# Patient Record
Sex: Male | Born: 1945
Health system: Northeastern US, Academic
[De-identification: ages and names within clinical notes are randomized; demographics above are authoritative.]

---

## 1999-03-05 ENCOUNTER — Encounter: Payer: Self-pay | Admitting: Cardiovascular Disease

## 2011-12-30 ENCOUNTER — Encounter: Payer: Self-pay | Admitting: Gastroenterology

## 2011-12-30 LAB — HM COLONOSCOPY

## 2013-05-17 ENCOUNTER — Encounter: Payer: Self-pay | Admitting: Gastroenterology

## 2013-05-25 ENCOUNTER — Encounter: Payer: Self-pay | Admitting: Gastroenterology

## 2013-07-11 ENCOUNTER — Encounter: Payer: Self-pay | Admitting: Gastroenterology

## 2013-08-18 ENCOUNTER — Encounter: Payer: Self-pay | Admitting: Gastroenterology

## 2014-07-25 ENCOUNTER — Encounter: Payer: Self-pay | Admitting: Gastroenterology

## 2014-07-25 LAB — LDL CHOLESTEROL, DIRECT: LDL Calculated: 92

## 2014-09-14 DIAGNOSIS — K2971 Gastritis, unspecified, with bleeding: Secondary | ICD-10-CM | POA: Insufficient documentation

## 2014-09-14 DIAGNOSIS — D649 Anemia, unspecified: Secondary | ICD-10-CM | POA: Insufficient documentation

## 2014-09-14 DIAGNOSIS — M48061 Spinal stenosis, lumbar region without neurogenic claudication: Secondary | ICD-10-CM | POA: Insufficient documentation

## 2014-09-14 DIAGNOSIS — R5381 Other malaise: Secondary | ICD-10-CM | POA: Insufficient documentation

## 2014-09-14 DIAGNOSIS — K5733 Diverticulitis of large intestine without perforation or abscess with bleeding: Secondary | ICD-10-CM | POA: Insufficient documentation

## 2014-09-14 DIAGNOSIS — N529 Male erectile dysfunction, unspecified: Secondary | ICD-10-CM | POA: Insufficient documentation

## 2014-09-14 DIAGNOSIS — M179 Osteoarthritis of knee, unspecified: Secondary | ICD-10-CM | POA: Insufficient documentation

## 2014-09-14 DIAGNOSIS — M1710 Unilateral primary osteoarthritis, unspecified knee: Secondary | ICD-10-CM | POA: Insufficient documentation

## 2014-09-14 DIAGNOSIS — M5459 Other low back pain: Secondary | ICD-10-CM | POA: Insufficient documentation

## 2014-09-14 DIAGNOSIS — K649 Unspecified hemorrhoids: Secondary | ICD-10-CM

## 2014-09-14 DIAGNOSIS — C61 Malignant neoplasm of prostate: Secondary | ICD-10-CM | POA: Insufficient documentation

## 2014-09-14 DIAGNOSIS — E785 Hyperlipidemia, unspecified: Secondary | ICD-10-CM | POA: Insufficient documentation

## 2014-09-14 DIAGNOSIS — Q825 Congenital non-neoplastic nevus: Secondary | ICD-10-CM | POA: Insufficient documentation

## 2014-09-14 DIAGNOSIS — N201 Calculus of ureter: Secondary | ICD-10-CM | POA: Insufficient documentation

## 2014-09-14 DIAGNOSIS — I10 Essential (primary) hypertension: Secondary | ICD-10-CM | POA: Insufficient documentation

## 2014-09-14 DIAGNOSIS — G4730 Sleep apnea, unspecified: Secondary | ICD-10-CM

## 2014-09-14 DIAGNOSIS — D126 Benign neoplasm of colon, unspecified: Secondary | ICD-10-CM | POA: Insufficient documentation

## 2014-09-14 DIAGNOSIS — A6000 Herpesviral infection of urogenital system, unspecified: Secondary | ICD-10-CM

## 2014-10-03 ENCOUNTER — Ambulatory Visit: Payer: Self-pay

## 2014-10-19 ENCOUNTER — Encounter: Payer: Self-pay | Admitting: Gastroenterology

## 2014-10-20 ENCOUNTER — Other Ambulatory Visit: Payer: Self-pay

## 2014-10-20 LAB — CBC AND DIFFERENTIAL
Hematocrit: 39
Hemoglobin: 12.2

## 2014-10-20 LAB — COMPREHENSIVE METABOLIC PANEL
ALT: 38
AST: 24
Chloride: 4
Creatinine: 0.9
Glucose: 101
Lab: 19

## 2014-10-20 LAB — LIPID PANEL
Cholesterol: 176
LDL Calculated: 91
Triglycerides: 127

## 2014-10-20 LAB — PSA,FLUID: PSA: <0.01

## 2014-10-20 LAB — TSH: TSH Stimulation: 1.73

## 2014-10-23 ENCOUNTER — Ambulatory Visit: Payer: Self-pay

## 2014-10-23 VITALS — BP 118/64 | HR 73 | Temp 97.3°F | Ht 70.5 in | Wt 231.6 lb

## 2014-10-23 DIAGNOSIS — M79662 Pain in left lower leg: Secondary | ICD-10-CM

## 2014-10-23 DIAGNOSIS — I10 Essential (primary) hypertension: Secondary | ICD-10-CM

## 2014-10-23 DIAGNOSIS — E782 Mixed hyperlipidemia: Secondary | ICD-10-CM

## 2014-10-23 NOTE — Progress Notes (Signed)
Jeremiah Harvey is a 69 y.o. male    Chief Complaint:  Chief Complaint     Hyperlipidemia; Hypertension; Leg Pain          HPI:  The primary encounter diagnosis was Benign hypertension. Diagnoses of Mixed hyperlipidemia and Pain of left calf were also pertinent to this visit.   Left calf muscle pull playing golf, improving    Review of Systems  No other sx        Objective:   Physical Exam  Vitals:    10/23/14 1430   BP: 118/64   Pulse: 73   Temp: 36.3 C (97.3 F)   TempSrc: Temporal   SpO2: 98%   Weight: 105.1 kg (231 lb 9.6 oz)   Height: 1.791 m (5' 10.5")     HEENT: Fundoscopy normal  Neck: no nodes, no bruits, normal thyroid  Lungs: clear  C-V Regular without murmur  Left calf: non-tender, no cords, neg Homan's, no swelling  Feet: no edema      Lab studies:  Recent Results (from the past 336 hour(s))   CBC and differential    Collection Time: 10/20/14 12:00 AM   Result Value Ref Range    WBC      RBC      Hemoglobin 12.2     Hematocrit 39.00     Platelets      Seg Neut %      Neut # K/uL  /L    Lymphocyte %      Lymph # K/uL  /L    Monocyte %      Mono # K/uL  /L    Eosinophil %      Eos # K/uL  /L    Basophil %      Baso # K/uL  /L    MCV      RDW      Nucl RBC %      Nucl RBC # K/uL     Comprehensive metabolic panel    Collection Time: 10/20/14 12:00 AM   Result Value Ref Range    Glucose 101     Sodium      Potassium      Chloride 4     CO2      Anion Gap      UN 19     Creatinine 0.90     GFR,Caucasian      GFR,Black      Calcium      Total Protein      Albumin      Bilirubin,Total      AST 24     ALT 38     Alk Phos     Lipid add Rfx to Drt LDL if Trig >400    Collection Time: 10/20/14 12:00 AM   Result Value Ref Range    Cholesterol 176     Chol/HDL Ratio      LDL Calculated 91     LDL Direct  mg/dl    Triglycerides 127     Non HDL Cholesterol      HDL     TSH    Collection Time: 10/20/14 12:00 AM   Result Value Ref Range    TSH Stimulation 1.73    PSA, fluid    Collection Time: 10/20/14 12:00 AM    Result Value Ref Range    PSA <0.01           Imaging:  No images are attached to  the encounter.           Assessment/Plan:  1. Benign hypertension  Controlled, continue current regimen.      2. Mixed hyperlipidemia  Low Fat Diet, Exercise, and Weight Loss   Medication as ordered      3. Pain of left calf  New problem, muscle strain.  Activity as tolerated               Dereck Leep, MD 10/23/2014 2:51 PM

## 2014-12-27 ENCOUNTER — Other Ambulatory Visit: Payer: Self-pay

## 2014-12-27 MED ORDER — LISINOPRIL 20 MG PO TABS *I*
20.0000 mg | ORAL_TABLET | Freq: Every day | ORAL | 1 refills | Status: DC
Start: 2014-12-27 — End: 2015-06-23

## 2014-12-27 MED ORDER — ATORVASTATIN CALCIUM 80 MG PO TABS *I*
80.0000 mg | ORAL_TABLET | Freq: Every day | ORAL | 1 refills | Status: DC
Start: 2014-12-27 — End: 2015-06-23

## 2014-12-27 NOTE — Telephone Encounter (Signed)
Patient is currently in [place], he is looking for a new doctor but does not have an interview until April 11, 2015.

## 2015-06-21 ENCOUNTER — Other Ambulatory Visit: Payer: Self-pay

## 2015-06-21 NOTE — Telephone Encounter (Signed)
NO VISITS SHOWN

## 2015-06-22 ENCOUNTER — Other Ambulatory Visit: Payer: Self-pay

## 2015-06-22 NOTE — Telephone Encounter (Signed)
Patient is now living in [place] permanently, explained that he will need to make an appointment before medication can be filled & suggested finding a PCP in [place] to assist needs. TBB

## 2015-06-23 MED ORDER — ATORVASTATIN CALCIUM 80 MG PO TABS *I*
80.0000 mg | ORAL_TABLET | Freq: Every day | ORAL | 0 refills | Status: AC
Start: 2015-06-23 — End: ?

## 2015-06-23 MED ORDER — LISINOPRIL 20 MG PO TABS *I*
20.0000 mg | ORAL_TABLET | Freq: Every day | ORAL | 0 refills | Status: AC
Start: 2015-06-23 — End: ?

## 2015-06-23 NOTE — Addendum Note (Signed)
Addended byRosezella Rumpf on: 06/23/2015 09:51 AM     Modules accepted: Orders

## 2015-06-23 NOTE — Telephone Encounter (Signed)
30 day supply sent

## 2022-06-12 IMAGING — DX SHOULDER RIGHT 3 VIEW
1 series · 3 of 3 positions shown · non-contrast
Comparison: None.

________________________________________________________________________________________________ 
SHOULDER RIGHT 3 VIEW, 06/12/2022 [DATE]: 
CLINICAL INDICATION: Shoulder pain.

[Series 1: ap int rot · U · 0.14mm/px · 3 of 3 slices shown]
[im 1/3]
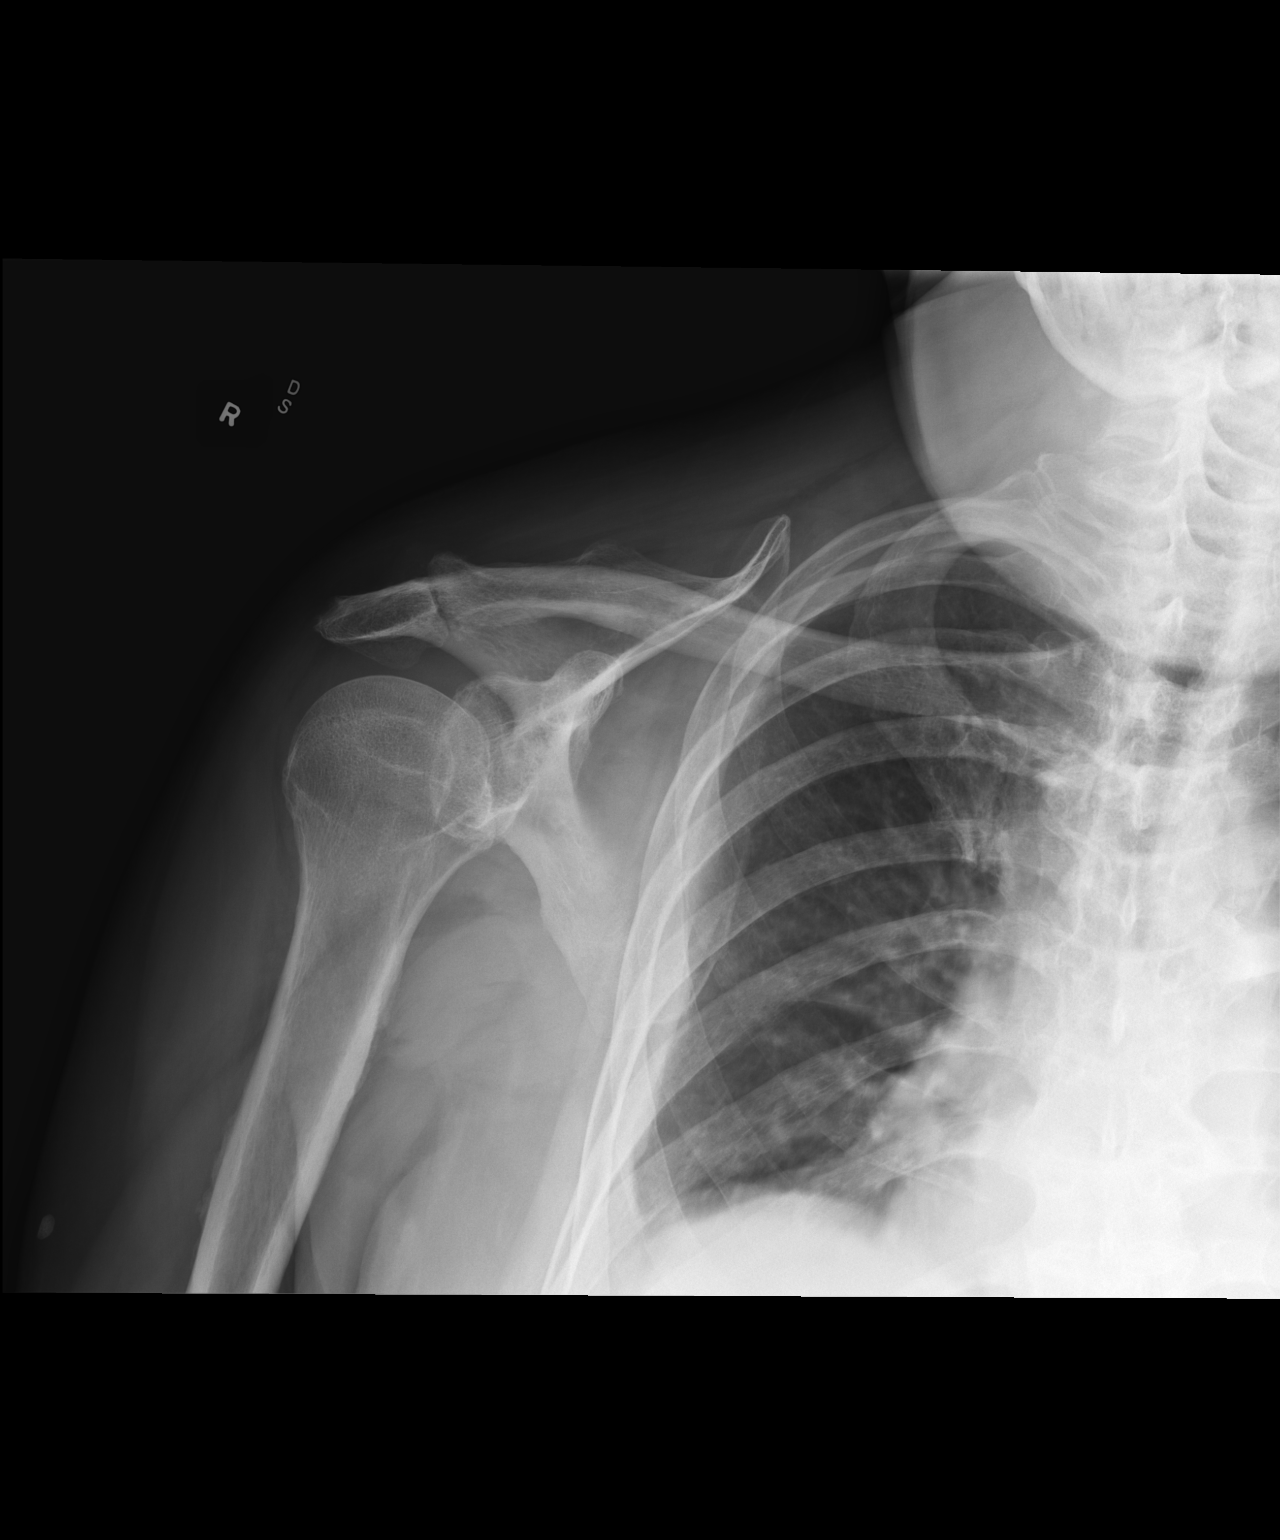
[im 2/3]
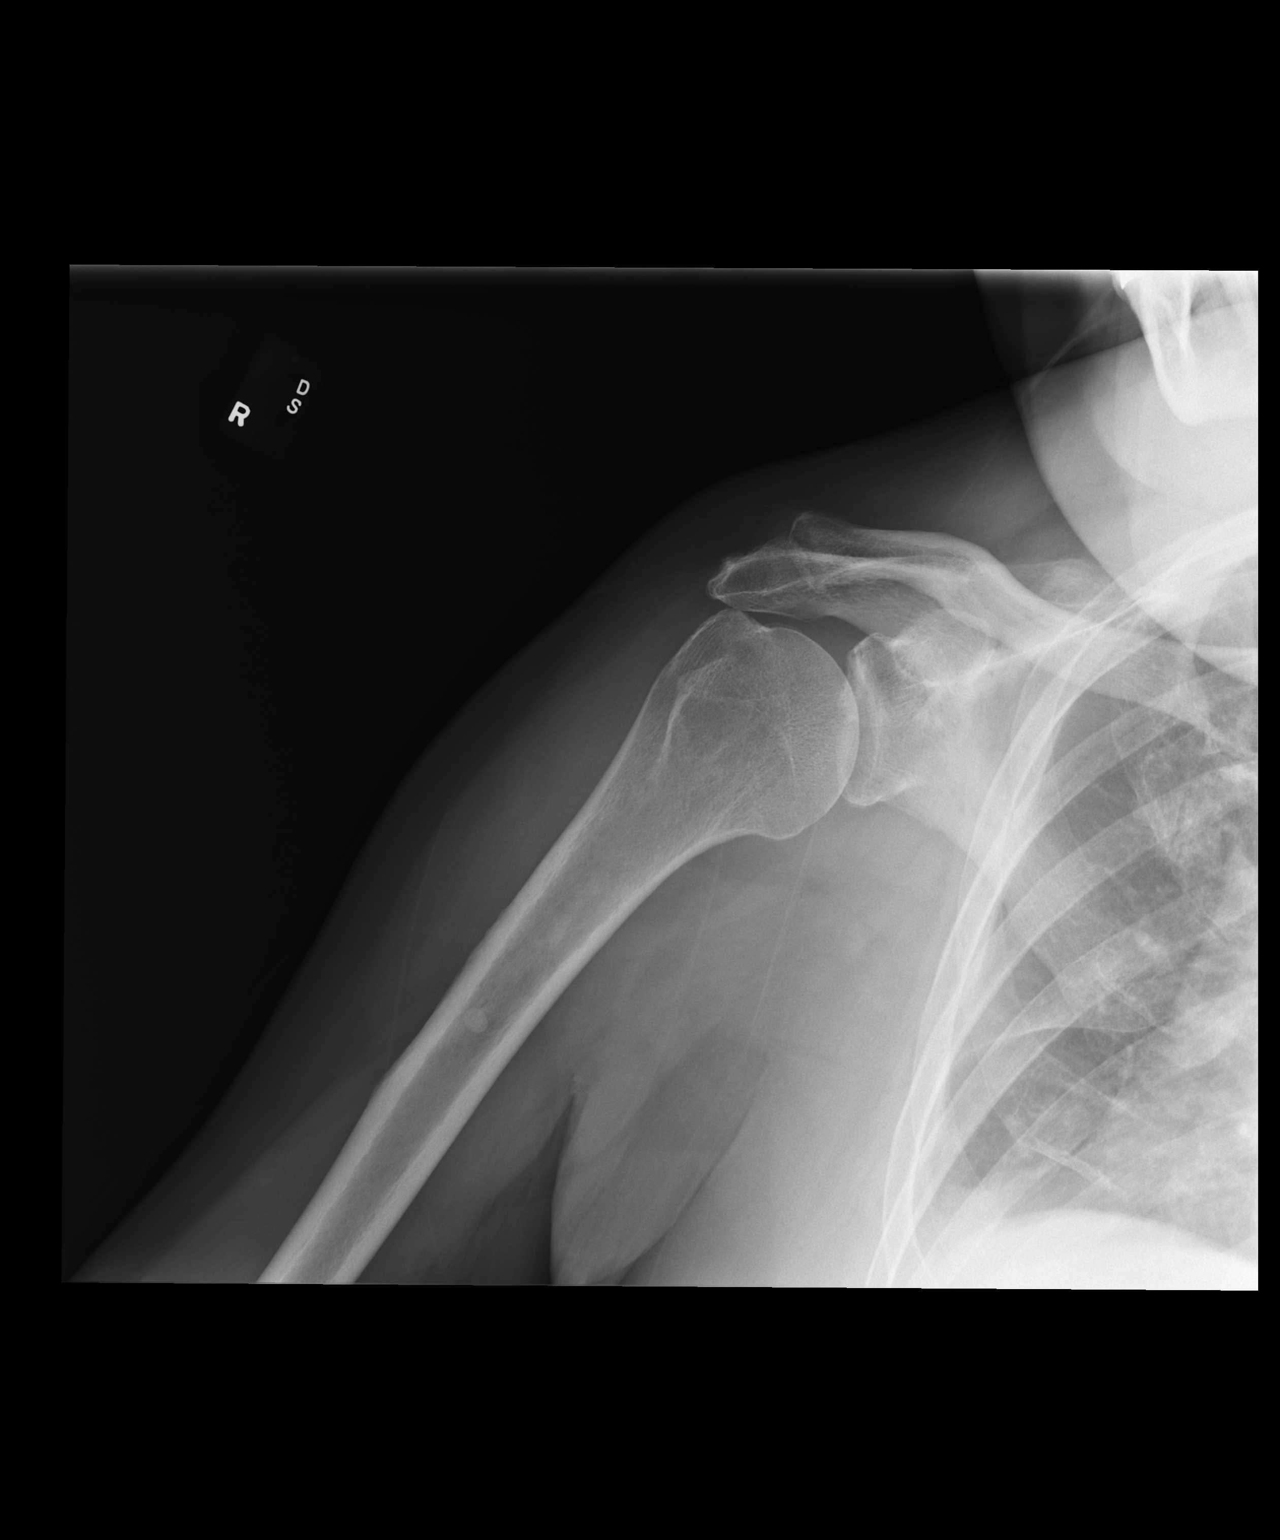
[im 3/3]
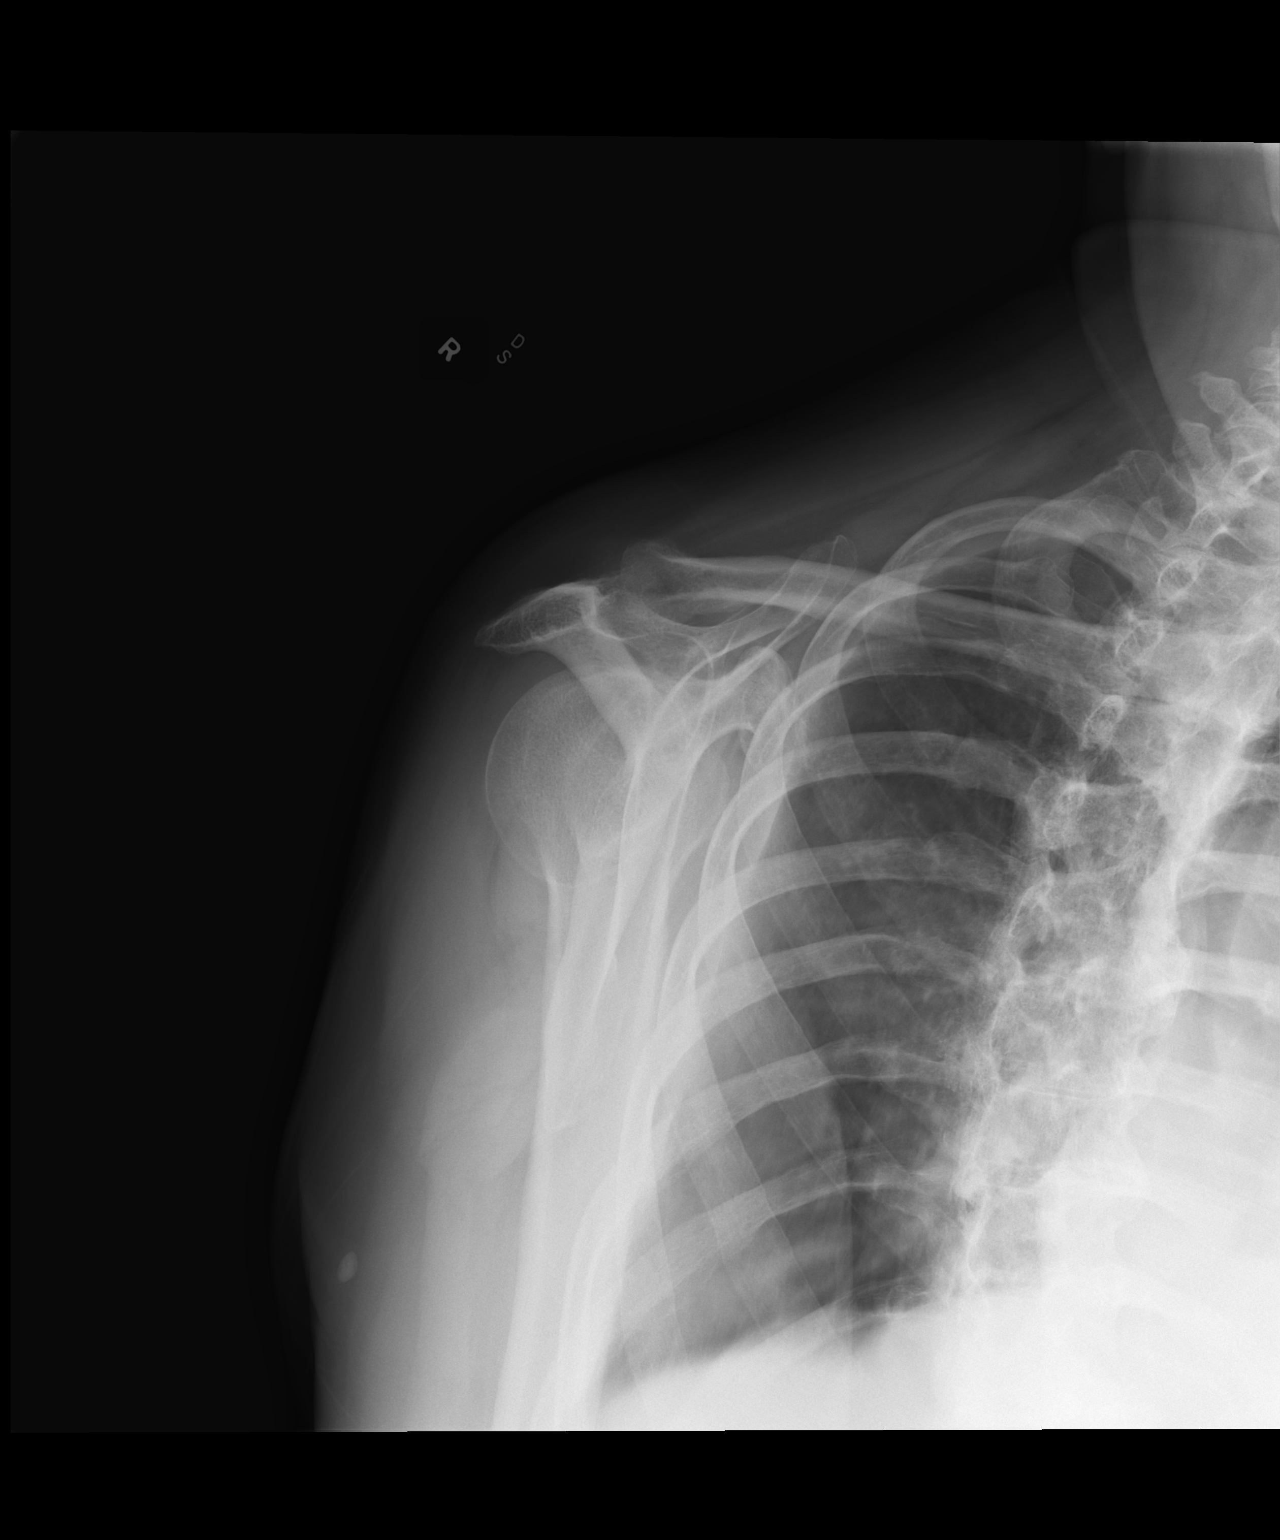

[3 of 3 positions shown; findings below may reference images not displayed]

FINDINGS: No fractures or dislocations. Mild degenerative change of the AC 
joint. Glenohumeral joint space is preserved. Normal bone density. 0.5 cm benign 
lateral arm subcutaneous/skin calcification.
IMPRESSION: Mild degenerative change of the AC joint.

## 2023-03-10 IMAGING — DX LUMBAR SPINE COMPLETE 4 VIEWS
4 series · 4 of 4 positions shown · non-contrast
Comparison: None

________________________________________________________________________________________________ 
LUMBAR SPINE COMPLETE 4 VIEWS, 03/10/2023 [DATE]: 
CLINICAL INDICATION: Radiculopathy, Lumbar Region

[AP]
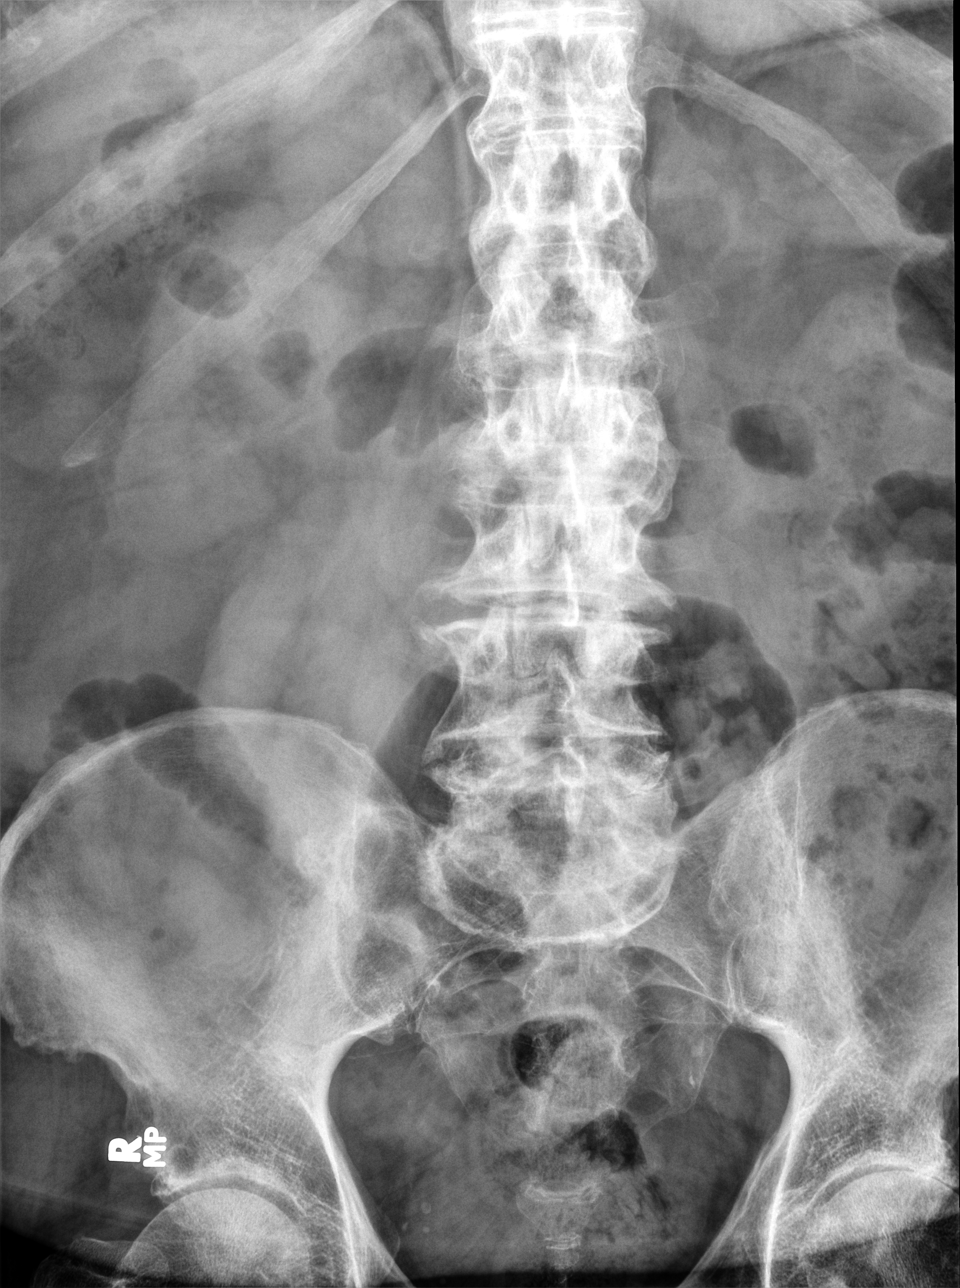

[rpo]
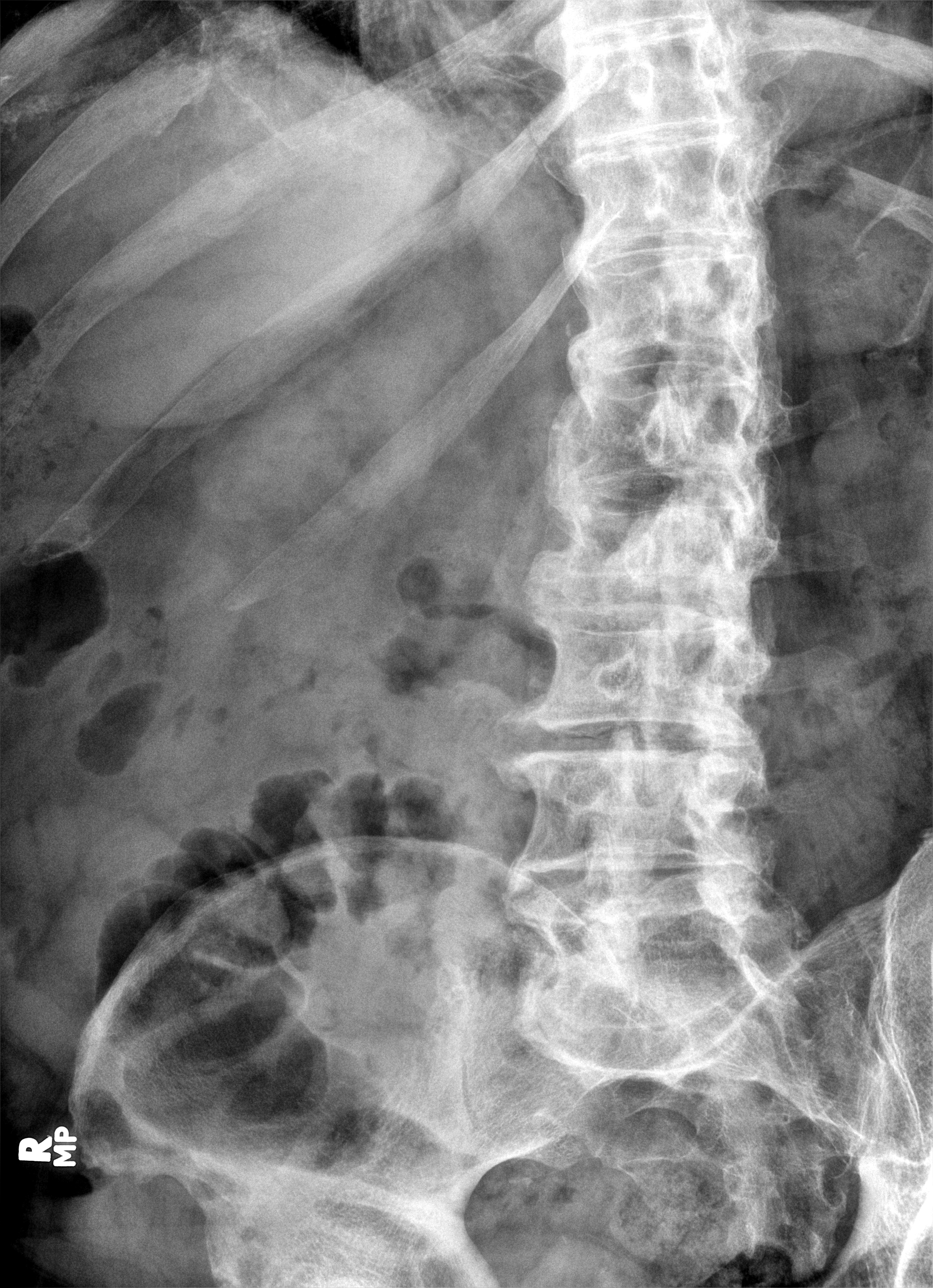

[lpo]
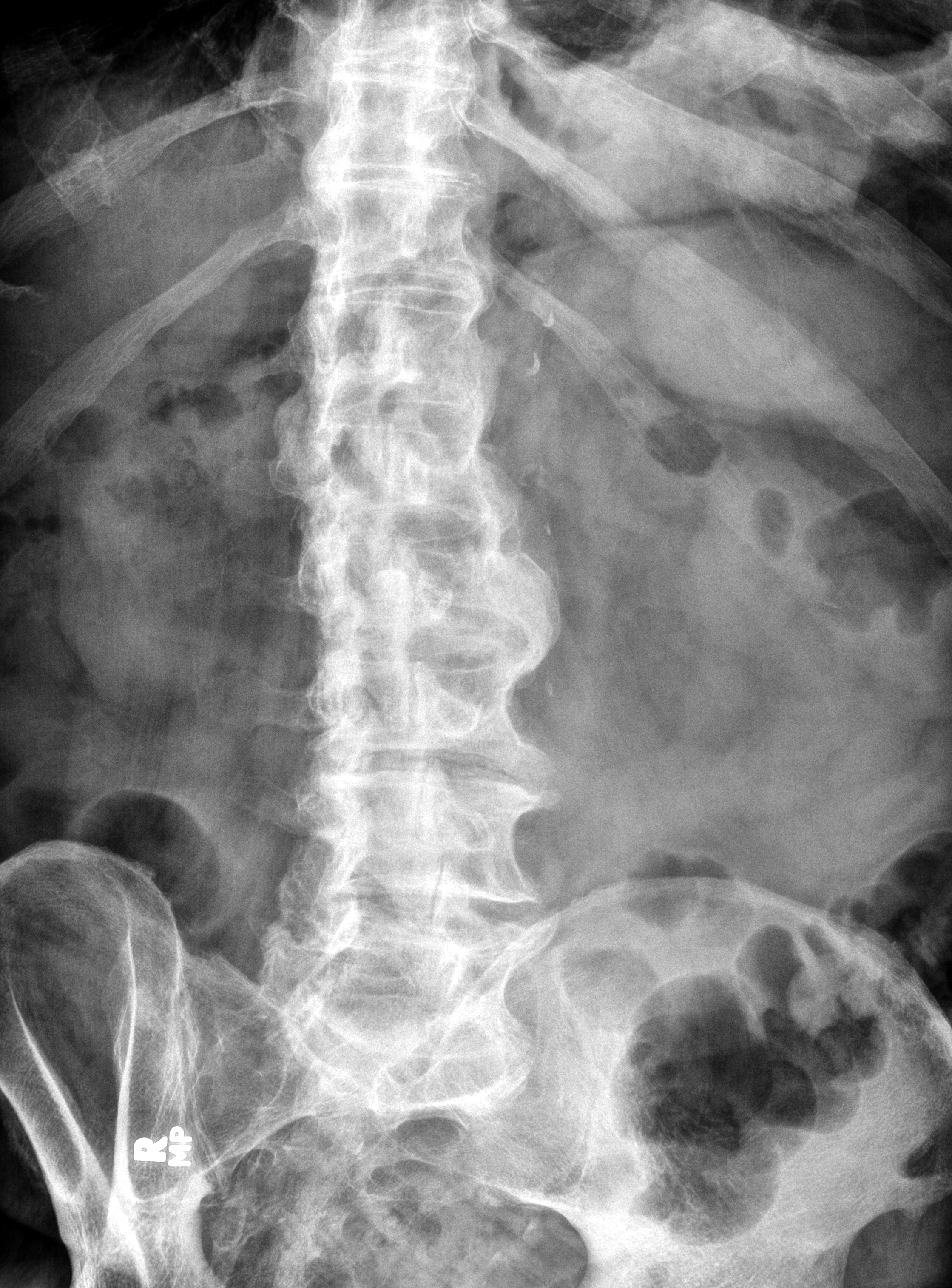

[lateral]
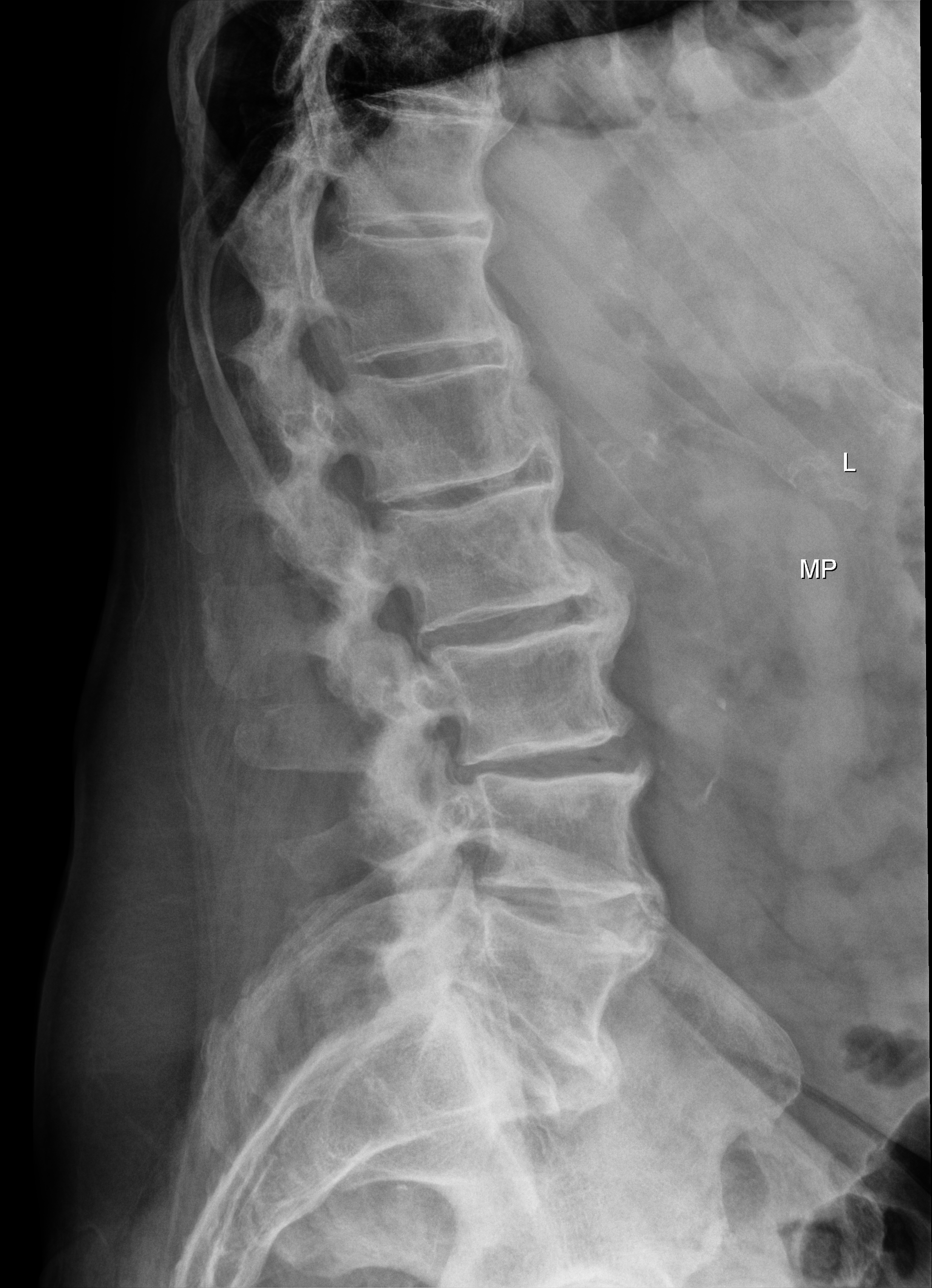

[4 of 4 positions shown; findings below may reference images not displayed]

FINDINGS: No fracture or subluxation. Moderate L5 and mild multifocal disc space 
narrowing. Multifocal osteophytes with anterior bridging. Lower 
lumbar/lumbosacral facet arthropathy. Degenerative change of the SI joints. 
Marked left and moderate right hip degenerative change. Atherosclerosis.
IMPRESSION: Multifocal degenerative change of the spine.

## 2023-03-25 IMAGING — MR MRI RIGHT SHOULDER WITHOUT CONTRAST
6 of 7 series · 25 of 40 positions shown · IV contrast (gadolinium)
Comparison: 06/30/2022 radiographs

________________________________________________________________________________________________ 
MRI RIGHT SHOULDER WITHOUT CONTRAST, 03/25/2023 [DATE]: 
CLINICAL INDICATION: Rheumatoid Arthritis Without Rheumatoid Factor, Unspecified 
Site
TECHNIQUE: Multiplanar, multiecho position MR images of the shoulder were 
performed without intravenous gadolinium enhancement. Patient was scanned on a 
3T magnet.

[Series 101: survey_fullfov_transversal · axial · right · 10.0mm · 1.84mm/px · z∈[-82,+82]mm · 2 of 12 slices shown]
[im 1/12]
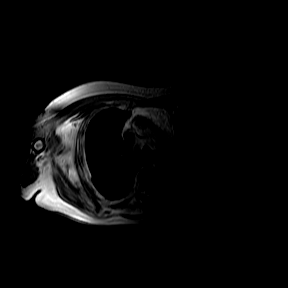
[im 12/12]
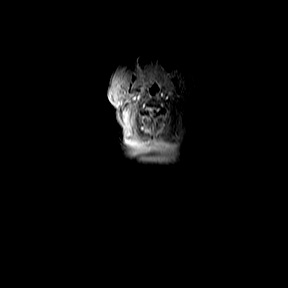

[Series 201: survey_right · axial · right · 10.0mm · 0.99mm/px · z∈[-40,+175]mm · 3 of 15 slices shown]
[im 1/15]
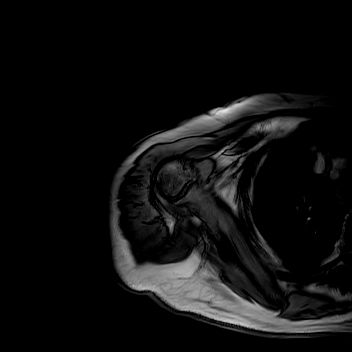
[im 8/15]
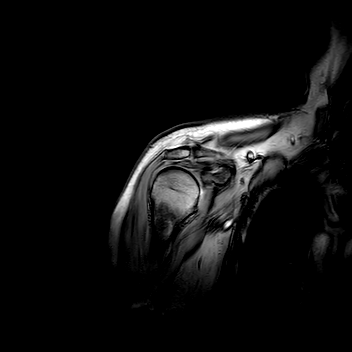
[im 15/15]
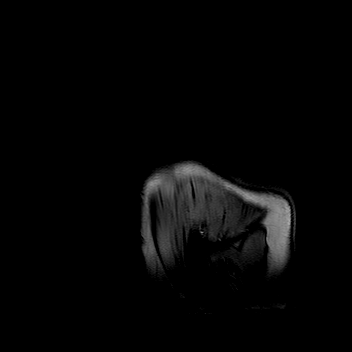

[Series 401: t2w spair right · oblique · right · 3.2mm · 0.38mm/px · 6 of 28 slices shown (1 of 2)]
[im 1/28]
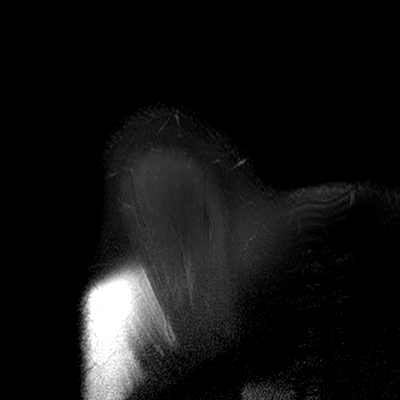
[im 6/28]
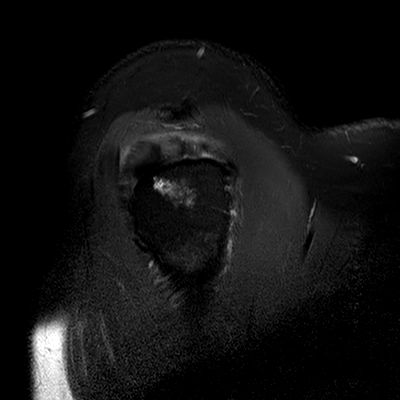
[im 11/28]
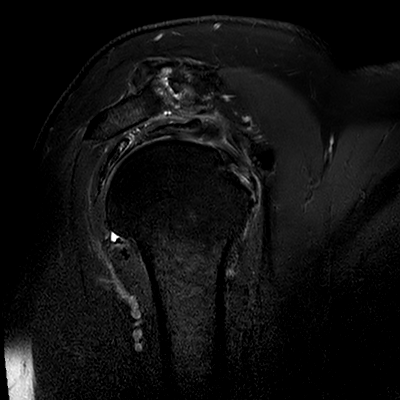
[im 17/28]
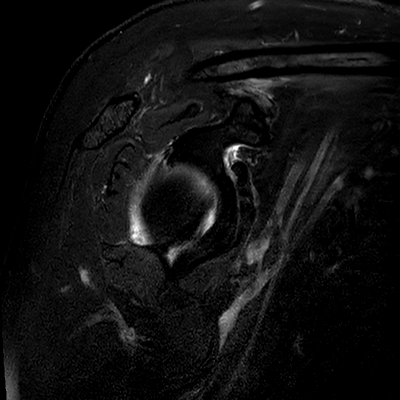
[im 22/28]
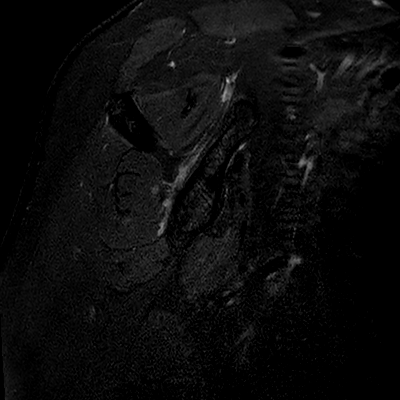
[im 28/28]
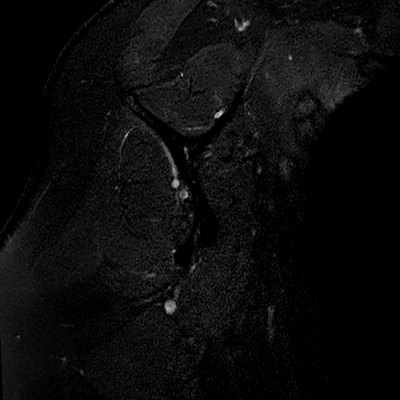

[Series 501: t2w spair right · oblique · right · 2.5mm · 0.38mm/px · 7 of 30 slices shown (2 of 2)]
[im 1/30]
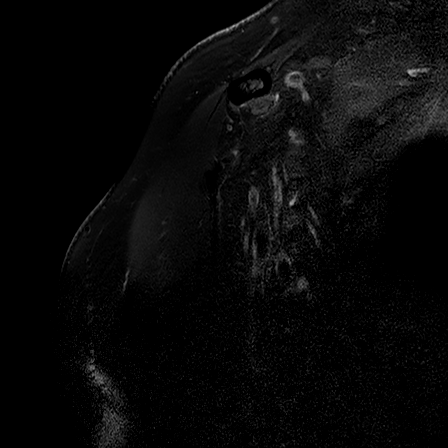
[im 5/30]
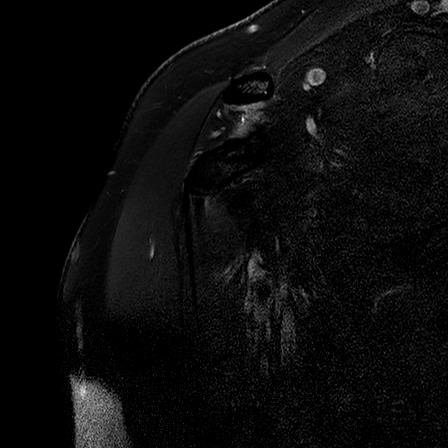
[im 10/30]
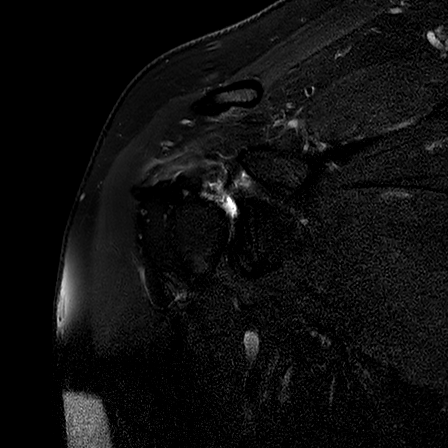
[im 15/30]
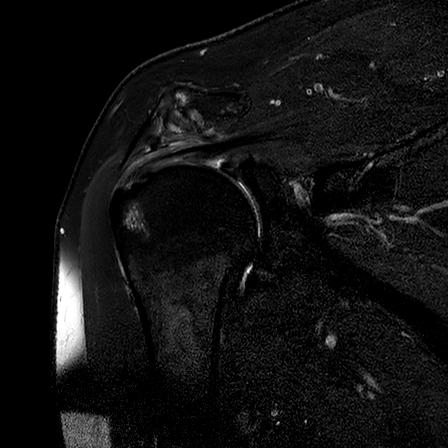
[im 20/30]
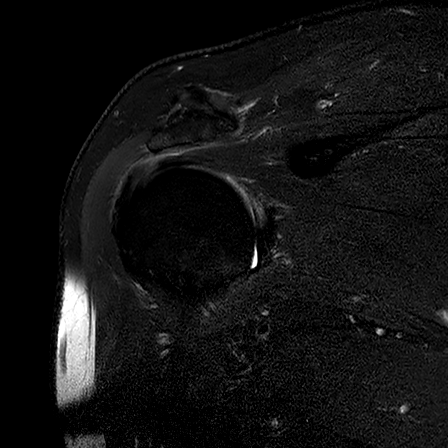
[im 25/30]
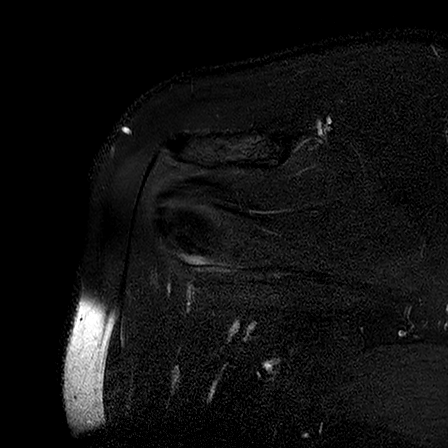
[im 30/30]
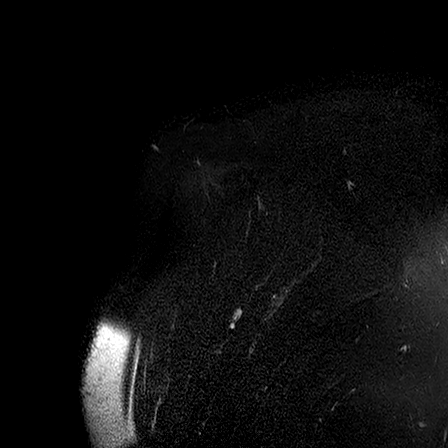

[Series 601: t1w tse sag · oblique · right · 3.2mm · 0.28mm/px · 6 of 28 slices shown]
[im 1/28]
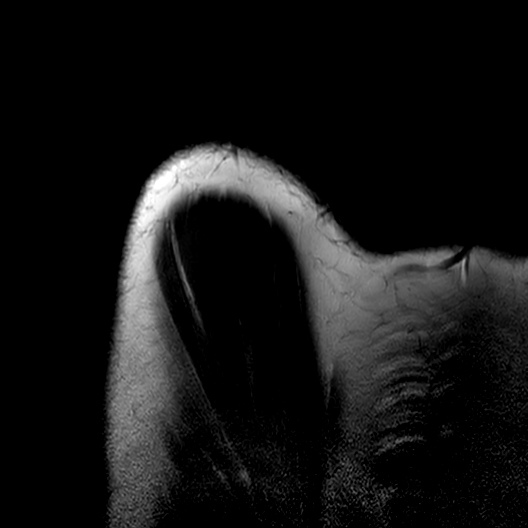
[im 6/28]
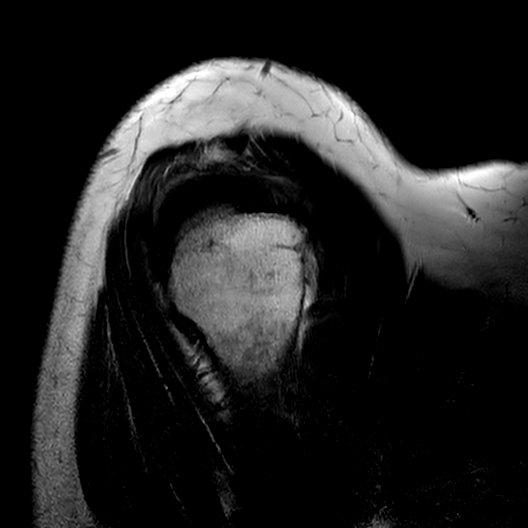
[im 11/28]
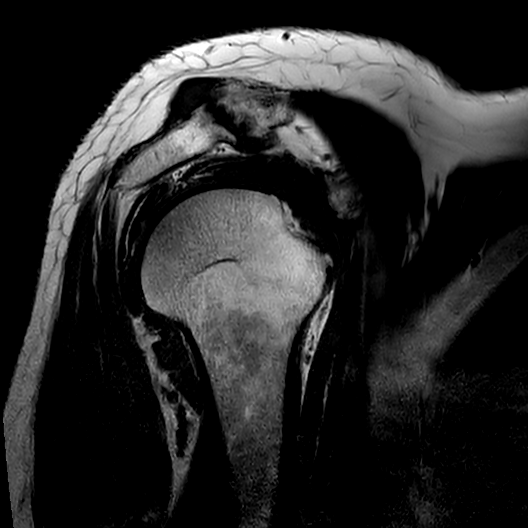
[im 17/28]
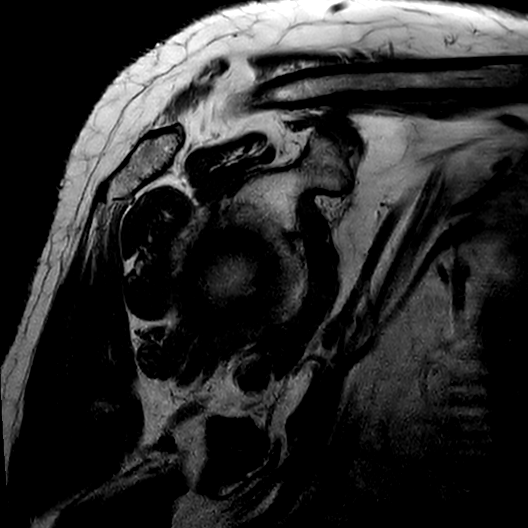
[im 22/28]
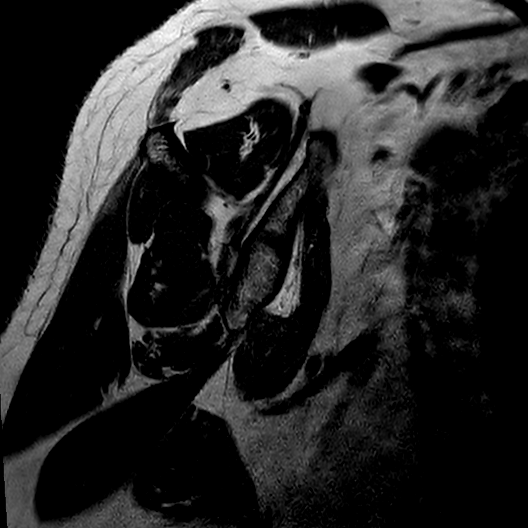
[im 28/28]
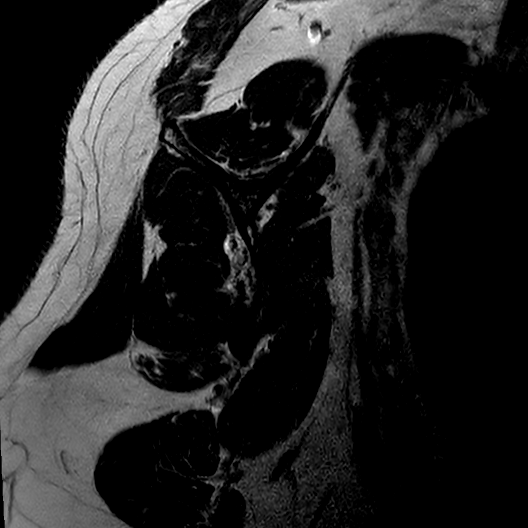

[Series 701: t2w tse cor · oblique · right · 2.5mm · 0.25mm/px · 1 of 30 slices shown]
[im 1/30]
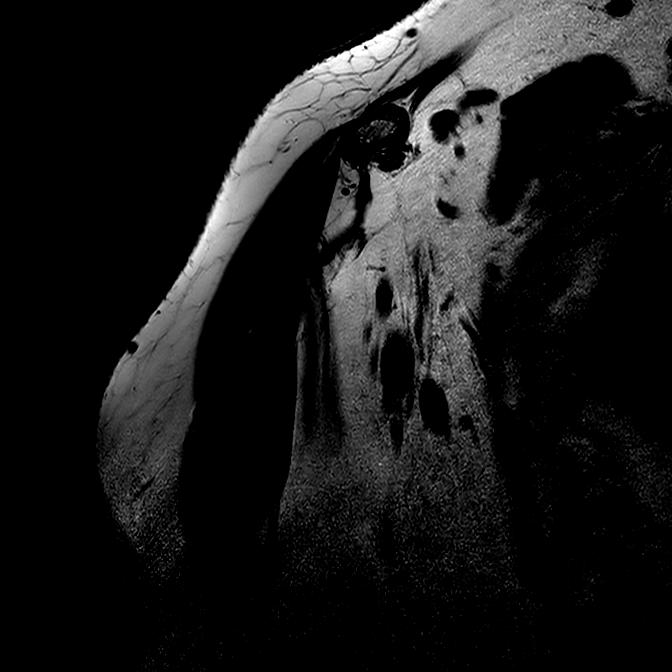

[25 of 40 positions shown; findings below may reference images not displayed]

FINDINGS: ROTATOR CUFF: Intermediate grade partial-thickness articular sided distal 
subscapularis tendon tear measures 0.7 cm in height. Small low-grade 
partial-thickness articular sided distal supraspinatus tendon tear measures
cm in AP dimensions. The infraspinatus and teres minor tendons are intact. The 
rotator cuff musculature is symmetric without mass, signal abnormality or 
atrophy. 
ACROMIOCLAVICULAR JOINT: Mild degenerative change of the acromioclavicular joint 
without mass effect upon the supraspinatus. The coracoacromial ligament is 
intact without prominent spurring at the acromial attachment. The 
acromioclavicular and coracoclavicular ligaments are preserved. The acromium is 
normal in morphology. 
GLENOHUMERAL JOINT: The humeral head is well located within the glenoid fossa. 
No erosions. Articular cartilage is preserved.  Degenerative posterior and 
posterosuperior labral tears. No paralabral cyst. The intra-articular portion of 
the long head of the biceps tendon is negative. No shoulder joint effusion. 
BONES: The bone marrow signal intensity is negative for fracture. No Hill-Sachs 
defect. Subcortical cystic change of the lateral humeral head. 
ADDITIONAL FINDINGS: The axillary region is negative. Subcutaneous tissues are 
negative.
IMPRESSION: 1.  0.7 cm intermediate grade partial-thickness articular sided distal 
subscapularis tendon tear  
2.  0.6 cm low-grade partial-thickness articular sided distal supraspinatus 
tendon tear.  
3.  Degenerative posterior and posterosuperior labral tears. 
4.  Mild AC joint degenerative change without mass effect upon the 
supraspinatus. 
5.  No erosions.
